# Patient Record
Sex: Female | Born: 1985 | Race: Black or African American | Hispanic: No | Marital: Single | Smoking: Never smoker
Health system: Southern US, Community
[De-identification: ages and names within clinical notes are randomized; demographics above are authoritative.]

---

## 2014-09-23 ENCOUNTER — Encounter (HOSPITAL_COMMUNITY): Payer: Self-pay | Admitting: Emergency Medicine

## 2014-09-23 ENCOUNTER — Emergency Department (HOSPITAL_COMMUNITY)
Admission: EM | Admit: 2014-09-23 | Discharge: 2014-09-23 | Disposition: A | Discharge status: Home / Self Care | Payer: Self-pay | Attending: Emergency Medicine | Admitting: Emergency Medicine

## 2014-09-23 ENCOUNTER — Emergency Department (HOSPITAL_COMMUNITY): Payer: Self-pay

## 2014-09-23 DIAGNOSIS — B9689 Other specified bacterial agents as the cause of diseases classified elsewhere: Secondary | ICD-10-CM

## 2014-09-23 DIAGNOSIS — R102 Pelvic and perineal pain unspecified side: Secondary | ICD-10-CM

## 2014-09-23 DIAGNOSIS — N760 Acute vaginitis: Secondary | ICD-10-CM | POA: Insufficient documentation

## 2014-09-23 DIAGNOSIS — D649 Anemia, unspecified: Secondary | ICD-10-CM | POA: Insufficient documentation

## 2014-09-23 LAB — COMPREHENSIVE METABOLIC PANEL
ALT: 12 U/L — ABNORMAL LOW (ref 14–54)
ANION GAP: 7 (ref 5–15)
AST: 17 U/L (ref 15–41)
Albumin: 3.9 g/dL (ref 3.5–5.0)
Alkaline Phosphatase: 51 U/L (ref 38–126)
BUN: 7 mg/dL (ref 6–20)
CHLORIDE: 106 mmol/L (ref 101–111)
CO2: 25 mmol/L (ref 22–32)
Calcium: 9.1 mg/dL (ref 8.9–10.3)
Creatinine, Ser: 0.57 mg/dL (ref 0.44–1.00)
GFR calc non Af Amer: >60 mL/min (ref >60)
Glucose, Bld: 116 mg/dL — ABNORMAL HIGH (ref 65–99)
Potassium: 4.1 mmol/L (ref 3.5–5.1)
SODIUM: 138 mmol/L (ref 135–145)
Total Bilirubin: 1 mg/dL (ref 0.3–1.2)
Total Protein: 7.4 g/dL (ref 6.5–8.1)

## 2014-09-23 LAB — URINALYSIS, ROUTINE W REFLEX MICROSCOPIC
Bilirubin Urine: NEGATIVE
GLUCOSE, UA: NEGATIVE mg/dL
Hgb urine dipstick: NEGATIVE
Ketones, ur: NEGATIVE mg/dL
LEUKOCYTES UA: NEGATIVE
Nitrite: NEGATIVE
PH: 5.5 (ref 5.0–8.0)
PROTEIN: NEGATIVE mg/dL
Specific Gravity, Urine: 1.015 (ref 1.005–1.030)
Urobilinogen, UA: 0.2 mg/dL (ref 0.0–1.0)

## 2014-09-23 LAB — CBC
HCT: 34.2 % — ABNORMAL LOW (ref 36.0–46.0)
HEMOGLOBIN: 10.8 g/dL — AB (ref 12.0–15.0)
MCH: 22.1 pg — AB (ref 26.0–34.0)
MCHC: 31.6 g/dL (ref 30.0–36.0)
MCV: 70.1 fL — AB (ref 78.0–100.0)
Platelets: 213 10*3/uL (ref 150–400)
RBC: 4.88 MIL/uL (ref 3.87–5.11)
RDW: 14.4 % (ref 11.5–15.5)
WBC: 4.1 10*3/uL (ref 4.0–10.5)

## 2014-09-23 LAB — LIPASE, BLOOD: LIPASE: 32 U/L (ref 22–51)

## 2014-09-23 LAB — WET PREP, GENITAL
TRICH WET PREP: NONE SEEN
YEAST WET PREP: NONE SEEN

## 2014-09-23 MED ORDER — IBUPROFEN 600 MG PO TABS: 600.0000 mg | ORAL_TABLET | Freq: Four times a day (QID) | ORAL | Status: DC | PRN

## 2014-09-23 MED ORDER — IBUPROFEN 200 MG PO TABS: 400.0000 mg | ORAL_TABLET | Freq: Four times a day (QID) | ORAL | Status: DC | PRN

## 2014-09-23 MED ORDER — DEXTROSE 5 % IV SOLN
250.0000 mg | Freq: Once | INTRAVENOUS | Status: AC
  Administered 2014-09-23: 250 mg via INTRAVENOUS
  Filled 2014-09-23 (×2): qty 250

## 2014-09-23 MED ORDER — ONDANSETRON HCL 4 MG/2ML IJ SOLN
4.0000 mg | Freq: Once | INTRAMUSCULAR | Status: AC
  Administered 2014-09-23: 4 mg via INTRAVENOUS
  Filled 2014-09-23: qty 2

## 2014-09-23 MED ORDER — AZITHROMYCIN 250 MG PO TABS
1000.0000 mg | ORAL_TABLET | Freq: Once | ORAL | Status: AC
  Administered 2014-09-23: 1000 mg via ORAL
  Filled 2014-09-23: qty 4

## 2014-09-23 MED ORDER — METRONIDAZOLE 500 MG PO TABS
2000.0000 mg | ORAL_TABLET | Freq: Once | ORAL | Status: AC
  Administered 2014-09-23: 2000 mg via ORAL
  Filled 2014-09-23: qty 4

## 2014-09-23 MED ORDER — MORPHINE SULFATE (PF) 4 MG/ML IV SOLN
4.0000 mg | Freq: Once | INTRAVENOUS | Status: AC
Start: 2014-09-23 — End: 2014-09-23
  Administered 2014-09-23: 4 mg via INTRAVENOUS
  Filled 2014-09-23: qty 1

## 2014-09-23 MED ORDER — SODIUM CHLORIDE 0.9 % IV BOLUS (SEPSIS)
1000.0000 mL | Freq: Once | INTRAVENOUS | Status: AC
  Administered 2014-09-23: 1000 mL via INTRAVENOUS

## 2014-09-23 NOTE — ED Notes (Signed)
Pt alert

## 2014-09-23 NOTE — ED Notes (Signed)
The pt is alert c/o being cold at present .  Warm blanket   hhn completed on arrival nasal 02 at 6 liters

## 2014-09-23 NOTE — ED Notes (Signed)
Patient states abdominal pain with radiation to low back pain x 2 weeks.   Denies nausea, vomiting.   Patient states did have some constipation for approximately 3 days, but has resolved.

## 2014-09-23 NOTE — ED Provider Notes (Signed)
CSN: [id number]     Arrival date & time 09/23/14  1324 History   First MD Initiated Contact with Patient 09/23/14 1055     Chief Complaint  Patient presents with  . Abdominal Pain  . Back Pain     (Consider location/radiation/quality/duration/timing/severity/associated sxs/prior Treatment) HPI   Lauren Speaking- patients friend is here helping to translate.  PCP: No primary care provider on file. Blood pressure 114/79, pulse 84, temperature 97.3 F (36.3 C), temperature source Oral, resp. rate 16, height  (1.626 m), weight 140 lb (63.504 kg), last menstrual period 08/31/2014, SpO2 100 %.  Lauren Hoffman is a 29 y.o.female with no significant PMH presents to the ER with complaints of lower suprapubic/RLQ abdominal pain that wraps around towards her back for the past two weeks that has been worsening. She is also having vaginal itching/burning. She has not had any nausea and vomiting. She did have constipation but it resolved. She also reports subjective fever once a few days ago but it relieved after Advil. She describes her discomfort at 8/10 currently.    The patient denies diaphoresis, headache, weakness (general or focal), confusion, change of vision,  neck pain, dysphagia, aphagia, chest pain, shortness of breath,  back pain, nausea, vomiting, diarrhea, lower extremity swelling, rash.  No past medical history on file. History reviewed. No pertinent past surgical history. No family history on file. Social History  Substance Use Topics  . Smoking status: Never Smoker   . Smokeless tobacco: None  . Alcohol Use: Yes   OB History    No data available     Review of Systems  10 Systems reviewed and are negative for acute change except as noted in the HPI.    Allergies  Review of patient's allergies indicates no known allergies.  Home Medications   Prior to Admission medications   Medication Sig Start Date End Date Taking? Authorizing Provider  ibuprofen  (ADVIL,MOTRIN) 200 MG tablet Take 2 tablets (400 mg total) by mouth every 6 (six) hours as needed for mild pain or moderate pain. 09/23/14   Clara Smolen Neva Seat, PA-C  ibuprofen (ADVIL,MOTRIN) 600 MG tablet Take 1 tablet (600 mg total) by mouth every 6 (six) hours as needed. 09/23/14   Yiannis Tulloch Neva Seat, PA-C   BP 98/56 mmHg  Pulse 80  Temp(Src) 97.3 F (36.3 C) (Oral)  Resp 18  Ht  (1.626 m)  Wt 140 lb (63.504 kg)  BMI 24.02 kg/m2  SpO2 100%  LMP 08/31/2014 Physical Exam  Constitutional: She appears well-developed and well-nourished. No distress.  HENT:  Head: Normocephalic and atraumatic.  Eyes: Pupils are equal, round, and reactive to light.  Neck: Normal range of motion. Neck supple.  Cardiovascular: Normal rate and regular rhythm.   Pulmonary/Chest: Effort normal.  Abdominal: Soft. Bowel sounds are normal. She exhibits no distension. There is tenderness in the suprapubic area. There is no rigidity, no rebound, no guarding and no CVA tenderness.  Genitourinary: Uterus normal. Cervix exhibits no motion tenderness and no discharge. Right adnexum displays no mass and no tenderness. Left adnexum displays tenderness. Left adnexum displays no mass. No tenderness in the vagina. Vaginal discharge (white creamy discharge) found.  Neurological: She is alert.  Skin: Skin is warm and dry.  Nursing note and vitals reviewed.   ED Course  Procedures (including critical care time) Labs Review Labs Reviewed  WET PREP, GENITAL - Abnormal; Notable for the following:    Clue Cells Wet Prep HPF POC MODERATE (*)  WBC, Wet Prep HPF POC MODERATE (*)    All other components within normal limits  COMPREHENSIVE METABOLIC PANEL - Abnormal; Notable for the following:    Glucose, Bld 116 (*)    ALT 12 (*)    All other components within normal limits  CBC - Abnormal; Notable for the following:    Hemoglobin 10.8 (*)    HCT 34.2 (*)    MCV 70.1 (*)    MCH 22.1 (*)    All other components within normal  limits  LIPASE, BLOOD  URINALYSIS, ROUTINE W REFLEX MICROSCOPIC (NOT AT [facility])  GC/CHLAMYDIA PROBE AMP ([place]) NOT AT [facility]    Imaging Review US Transvaginal Non-ob  09/23/2014   CLINICAL DATA:  Right lower quadrant suprapubic pain for 2 weeks. Question torsion.  EXAM: TRANSABDOMINAL AND TRANSVAGINAL ULTRASOUND OF PELVIS  DOPPLER ULTRASOUND OF OVARIES  TECHNIQUE: Both transabdominal and transvaginal ultrasound examinations of the pelvis were performed. Transabdominal technique was performed for global imaging of the pelvis including uterus, ovaries, adnexal regions, and pelvic cul-de-sac.  It was necessary to proceed with endovaginal exam following the transabdominal exam to visualize the uterus, endometrium, ovaries and adnexal regions. Color and duplex Doppler ultrasound was utilized to evaluate blood flow to the ovaries.  COMPARISON:  None.  FINDINGS: Uterus  Measurements: 9.3 x 5.0 x 5.0 cm. No fibroids or other mass visualized.  Endometrium  Thickness: 8 mm.  No focal abnormality visualized.  Right ovary  Measurements: 3.7 x 2.3 x 2.4 cm. Normal appearance/no adnexal mass.  Left ovary  Measurements: 3.8 x 2.7 x 2.5 cm. Normal appearance/no adnexal mass.  Pulsed Doppler evaluation of both ovaries demonstrates normal low-resistance arterial and venous waveforms.  Other findings  No free fluid.  IMPRESSION: Normal exam.   Electronically Signed   By: Leanna Battles M.D.   On: 09/23/2014 15:35   US Pelvis Complete  09/23/2014   CLINICAL DATA:  Right lower quadrant suprapubic pain for 2 weeks. Question torsion.  EXAM: TRANSABDOMINAL AND TRANSVAGINAL ULTRASOUND OF PELVIS  DOPPLER ULTRASOUND OF OVARIES  TECHNIQUE: Both transabdominal and transvaginal ultrasound examinations of the pelvis were performed. Transabdominal technique was performed for global imaging of the pelvis including uterus, ovaries, adnexal regions, and pelvic cul-de-sac.  It was necessary to proceed with endovaginal exam following the  transabdominal exam to visualize the uterus, endometrium, ovaries and adnexal regions. Color and duplex Doppler ultrasound was utilized to evaluate blood flow to the ovaries.  COMPARISON:  None.  FINDINGS: Uterus  Measurements: 9.3 x 5.0 x 5.0 cm. No fibroids or other mass visualized.  Endometrium  Thickness: 8 mm.  No focal abnormality visualized.  Right ovary  Measurements: 3.7 x 2.3 x 2.4 cm. Normal appearance/no adnexal mass.  Left ovary  Measurements: 3.8 x 2.7 x 2.5 cm. Normal appearance/no adnexal mass.  Pulsed Doppler evaluation of both ovaries demonstrates normal low-resistance arterial and venous waveforms.  Other findings  No free fluid.  IMPRESSION: Normal exam.   Electronically Signed   By: Leanna Battles M.D.   On: 09/23/2014 15:35   [place] Art/ven Flow Abd Pelv Doppler  09/23/2014   CLINICAL DATA:  Right lower quadrant suprapubic pain for 2 weeks. Question torsion.  EXAM: TRANSABDOMINAL AND TRANSVAGINAL ULTRASOUND OF PELVIS  DOPPLER ULTRASOUND OF OVARIES  TECHNIQUE: Both transabdominal and transvaginal ultrasound examinations of the pelvis were performed. Transabdominal technique was performed for global imaging of the pelvis including uterus, ovaries, adnexal regions, and pelvic cul-de-sac.  It was necessary to proceed  with endovaginal exam following the transabdominal exam to visualize the uterus, endometrium, ovaries and adnexal regions. Color and duplex Doppler ultrasound was utilized to evaluate blood flow to the ovaries.  COMPARISON:  None.  FINDINGS: Uterus  Measurements: 9.3 x 5.0 x 5.0 cm. No fibroids or other mass visualized.  Endometrium  Thickness: 8 mm.  No focal abnormality visualized.  Right ovary  Measurements: 3.7 x 2.3 x 2.4 cm. Normal appearance/no adnexal mass.  Left ovary  Measurements: 3.8 x 2.7 x 2.5 cm. Normal appearance/no adnexal mass.  Pulsed Doppler evaluation of both ovaries demonstrates normal low-resistance arterial and venous waveforms.  Other findings  No free fluid.   IMPRESSION: Normal exam.   Electronically Signed   By: Leanna Battles M.D.   On: 09/23/2014 15:35   I have personally reviewed and evaluated these images and lab results as part of my medical decision-making.   EKG Interpretation None      MDM   Final diagnoses:  Bacterial vaginosis    Patients labs show that she is positive for moderate clue cells and WBC. She denies sexual activity within the past 6 months. Agrees for treatment with abx in the ED.  Lipase, CMP, CBC and urinalysis show no acute findings. She is mildly anemic. Will order non OB US to further evaluate the RLQ pain and tenderness. R/o torsion/infarct, abscess or PID.  Medications  morphine 4 MG/ML injection 4 mg (4 mg Intravenous Given 09/23/14 1210)  ondansetron (ZOFRAN) injection 4 mg (4 mg Intravenous Given 09/23/14 1210)  sodium chloride 0.9 % bolus 1,000 mL (0 mLs Intravenous Stopped 09/23/14 1315)  cefTRIAXone (ROCEPHIN) 250 mg in dextrose 5 % 50 mL IVPB (0 mg Intravenous Stopped 09/23/14 1421)  azithromycin (ZITHROMAX) tablet 1,000 mg (1,000 mg Oral Given 09/23/14 1257)  metroNIDAZOLE (FLAGYL) tablet 2,000 mg (2,000 mg Oral Given 09/23/14 1257)  ondansetron (ZOFRAN) injection 4 mg (4 mg Intravenous Given 09/23/14 1301)    Normal [place], patient is feeling better. Referral to womens and pt education given. GC are pending.  Medications  morphine 4 MG/ML injection 4 mg (4 mg Intravenous Given 09/23/14 1210)  ondansetron (ZOFRAN) injection 4 mg (4 mg Intravenous Given 09/23/14 1210)  sodium chloride 0.9 % bolus 1,000 mL (0 mLs Intravenous Stopped 09/23/14 1315)  cefTRIAXone (ROCEPHIN) 250 mg in dextrose 5 % 50 mL IVPB (0 mg Intravenous Stopped 09/23/14 1421)  azithromycin (ZITHROMAX) tablet 1,000 mg (1,000 mg Oral Given 09/23/14 1257)  metroNIDAZOLE (FLAGYL) tablet 2,000 mg (2,000 mg Oral Given 09/23/14 1257)  ondansetron (ZOFRAN) injection 4 mg (4 mg Intravenous Given 09/23/14 1301)    28 y.o.Lauren Hoffman's  evaluation in the Emergency Department is complete. It has been determined that no acute conditions requiring further emergency intervention are present at this time. The patient/guardian have been advised of the diagnosis and plan. We have discussed signs and symptoms that warrant return to the ED, such as changes or worsening in symptoms.  Vital signs are stable at discharge. Filed Vitals:   09/23/14 1528  BP: 98/56  Pulse: 80  Temp:   Resp: 18    Patient/guardian has voiced understanding and agreed to follow-up with the PCP or specialist.     Marlon Pel, PA-C 09/23/14 1543  Linwood Dibbles, MD 09/27/14 [phone]

## 2014-09-23 NOTE — Discharge Instructions (Signed)
Bacterial Vaginosis Bacterial vaginosis is a vaginal infection that occurs when the normal balance of bacteria in the vagina is disrupted. It results from an overgrowth of certain bacteria. This is the most common vaginal infection in women of childbearing age. Treatment is important to prevent complications, especially in pregnant women, as it can cause a premature delivery. CAUSES  Bacterial vaginosis is caused by an increase in harmful bacteria that are normally present in smaller amounts in the vagina. Several different kinds of bacteria can cause bacterial vaginosis. However, the reason that the condition develops is not fully understood. RISK FACTORS Certain activities or behaviors can put you at an increased risk of developing bacterial vaginosis, including:  Having a new sex partner or multiple sex partners.  Douching.  Using an intrauterine device (IUD) for contraception. Women do not get bacterial vaginosis from toilet seats, bedding, swimming pools, or contact with objects around them. SIGNS AND SYMPTOMS  Some women with bacterial vaginosis have no signs or symptoms. Common symptoms include:  Grey vaginal discharge.  A fishlike odor with discharge, especially after sexual intercourse.  Itching or burning of the vagina and vulva.  Burning or pain with urination. DIAGNOSIS  Your health care provider will take a medical history and examine the vagina for signs of bacterial vaginosis. A sample of vaginal fluid may be taken. Your health care provider will look at this sample under a microscope to check for bacteria and abnormal cells. A vaginal pH test may also be done.  TREATMENT  Bacterial vaginosis may be treated with antibiotic medicines. These may be given in the form of a pill or a vaginal cream. A second round of antibiotics may be prescribed if the condition comes back after treatment.  HOME CARE INSTRUCTIONS   Only take over-the-counter or prescription medicines as  directed by your health care provider.  If antibiotic medicine was prescribed, take it as directed. Make sure you finish it even if you start to feel better.  Do not have sex until treatment is completed.  Tell all sexual partners that you have a vaginal infection. They should see their health care provider and be treated if they have problems, such as a mild rash or itching.  Practice safe sex by using condoms and only having one sex partner. SEEK MEDICAL CARE IF:   Your symptoms are not improving after 3 days of treatment.  You have increased discharge or pain.  You have a fever. MAKE SURE YOU:   Understand these instructions.  Will watch your condition.  Will get help right away if you are not doing well or get worse. FOR MORE INFORMATION  Centers for Disease Control and Prevention, Division of STD Prevention: www.cdc.gov/std American Sexual Health Association (ASHA): www.ashastd.org  Document Released: 01/23/2005 Document Revised: 11/13/2012 Document Reviewed: 09/04/2012 ExitCare Patient Information 2015 [employer]. This information is not intended to replace advice given to you by your health care provider. Make sure you discuss any questions you have with your health care provider.  

## 2014-09-23 NOTE — Progress Notes (Signed)
Buddy Duty [facility]  & Eligibility Specialist Partnership for [facility] [phone]  Spoke to patient regarding primary care resources and the [facility] orange card. Orange Physicist, medical provided and explained to patient and family at bedside. My contact information also provided for any future questions or concerns. No other [facility]  & Eligibility Specialist needs identified at this time.

## 2014-09-23 NOTE — ED Notes (Addendum)
Patient's primary language is [place].

## 2014-09-24 LAB — GC/CHLAMYDIA PROBE AMP (~~LOC~~) NOT AT ARMC
Chlamydia: NEGATIVE
NEISSERIA GONORRHEA: NEGATIVE

## 2014-12-18 ENCOUNTER — Ambulatory Visit (INDEPENDENT_AMBULATORY_CARE_PROVIDER_SITE_OTHER): Payer: No Typology Code available for payment source | Admitting: Family Medicine

## 2014-12-18 ENCOUNTER — Encounter: Payer: Self-pay | Admitting: Family Medicine

## 2014-12-18 ENCOUNTER — Other Ambulatory Visit (HOSPITAL_COMMUNITY)
Admission: RE | Admit: 2014-12-18 | Discharge: 2014-12-18 | Disposition: A | Discharge status: Home / Self Care | Payer: No Typology Code available for payment source | Source: Ambulatory Visit | Attending: Internal Medicine | Admitting: Internal Medicine

## 2014-12-18 ENCOUNTER — Other Ambulatory Visit: Payer: Self-pay | Admitting: Family Medicine

## 2014-12-18 VITALS — BP 103/66 | HR 80 | Temp 98.7°F | Resp 16 | Ht 66.5 in | Wt 131.0 lb

## 2014-12-18 DIAGNOSIS — L298 Other pruritus: Secondary | ICD-10-CM

## 2014-12-18 DIAGNOSIS — N760 Acute vaginitis: Secondary | ICD-10-CM

## 2014-12-18 DIAGNOSIS — Z0000 Encounter for general adult medical examination without abnormal findings: Secondary | ICD-10-CM

## 2014-12-18 DIAGNOSIS — Z01419 Encounter for gynecological examination (general) (routine) without abnormal findings: Secondary | ICD-10-CM

## 2014-12-18 DIAGNOSIS — B9689 Other specified bacterial agents as the cause of diseases classified elsewhere: Secondary | ICD-10-CM

## 2014-12-18 DIAGNOSIS — R5383 Other fatigue: Secondary | ICD-10-CM

## 2014-12-18 DIAGNOSIS — Z23 Encounter for immunization: Secondary | ICD-10-CM

## 2014-12-18 DIAGNOSIS — Z131 Encounter for screening for diabetes mellitus: Secondary | ICD-10-CM

## 2014-12-18 DIAGNOSIS — N898 Other specified noninflammatory disorders of vagina: Secondary | ICD-10-CM

## 2014-12-18 DIAGNOSIS — A499 Bacterial infection, unspecified: Secondary | ICD-10-CM

## 2014-12-18 DIAGNOSIS — Z202 Contact with and (suspected) exposure to infections with a predominantly sexual mode of transmission: Secondary | ICD-10-CM

## 2014-12-18 DIAGNOSIS — D649 Anemia, unspecified: Secondary | ICD-10-CM

## 2014-12-18 LAB — POCT WET + KOH PREP
Trich by wet prep: ABSENT
YEAST BY KOH: ABSENT
Yeast by wet prep: ABSENT

## 2014-12-18 LAB — CBC WITH DIFFERENTIAL/PLATELET
BASOS ABS: 0 10*3/uL (ref 0.0–0.1)
BASOS PCT: 1 % (ref 0–1)
Eosinophils Absolute: 0 10*3/uL (ref 0.0–0.7)
Eosinophils Relative: 1 % (ref 0–5)
HCT: 35.4 % — ABNORMAL LOW (ref 36.0–46.0)
HEMOGLOBIN: 11.1 g/dL — AB (ref 12.0–15.0)
Lymphocytes Relative: 60 % — ABNORMAL HIGH (ref 12–46)
Lymphs Abs: 2.6 10*3/uL (ref 0.7–4.0)
MCH: 21.9 pg — ABNORMAL LOW (ref 26.0–34.0)
MCHC: 31.4 g/dL (ref 30.0–36.0)
MCV: 70 fL — ABNORMAL LOW (ref 78.0–100.0)
MONOS PCT: 7 % (ref 3–12)
Monocytes Absolute: 0.3 10*3/uL (ref 0.1–1.0)
NEUTROS ABS: 1.4 10*3/uL — AB (ref 1.7–7.7)
NEUTROS PCT: 31 % — AB (ref 43–77)
Platelets: 290 10*3/uL (ref 150–400)
RBC: 5.06 MIL/uL (ref 3.87–5.11)
RDW: 15.5 % (ref 11.5–15.5)
WBC: 4.4 10*3/uL (ref 4.0–10.5)

## 2014-12-18 LAB — COMPLETE METABOLIC PANEL WITH GFR
ALK PHOS: 55 U/L (ref 33–115)
ALT: 9 U/L (ref 6–29)
AST: 14 U/L (ref 10–30)
Albumin: 4.4 g/dL (ref 3.6–5.1)
BILIRUBIN TOTAL: 1.1 mg/dL (ref 0.2–1.2)
BUN: 9 mg/dL (ref 7–25)
CO2: 25 mmol/L (ref 20–31)
Calcium: 9.8 mg/dL (ref 8.6–10.2)
Chloride: 102 mmol/L (ref 98–110)
Creat: 0.59 mg/dL (ref 0.50–1.10)
GFR, Est African American: >89 mL/min (ref >=60)
Glucose, Bld: 91 mg/dL (ref 65–99)
Potassium: 4.1 mmol/L (ref 3.5–5.3)
SODIUM: 137 mmol/L (ref 135–146)
Total Protein: 7.5 g/dL (ref 6.1–8.1)

## 2014-12-18 LAB — TSH: TSH: 1.048 u[IU]/mL (ref 0.350–4.500)

## 2014-12-18 LAB — HEMOGLOBIN A1C
HEMOGLOBIN A1C: 5.8 % — AB (ref <5.7)
MEAN PLASMA GLUCOSE: 120 mg/dL — AB (ref <117)

## 2014-12-18 MED ORDER — METRONIDAZOLE 500 MG PO TABS: 500.0000 mg | ORAL_TABLET | Freq: Two times a day (BID) | ORAL | Status: DC

## 2014-12-18 NOTE — Progress Notes (Signed)
Subjective:    Patient ID: Lauren Hoffman, female    DOB: 06-04-1985, 29 y.o.   MRN: [id number]  HPI Lauren Hoffman, a 29 year old female presents to establish care. Patient is accompanied by a interpreter, she primarily speaks [place]. She immigrated from the [place], 1 year ago. She is currently complaining of vaginal itching and vaginal discharge over the past several weeks. Lauren Hoffman states that she is not currently sexually active. She has not been sexually active for greater than 1 year. She reports white vaginal discharge and some itching. She denies a foul smell. She states that it has been several years since last pap smear. She maintains that menstrual cycles are negative. She reports some fatigue, denies fever, chest pains, muscle aches, urinary problems, nausea, vomiting, or diarrhea.   History reviewed. No pertinent past medical history.  Immunization History  Administered Date(s) Administered  . Influenza,inj,Quad PF,36+ Mos 12/18/2014  No Known Allergies  Social History   Social History  . Marital Status: Single    Spouse Name: N/A  . Number of Children: N/A  . Years of Education: N/A   Occupational History  . Not on file.   Social History Main Topics  . Smoking status: Never Smoker   . Smokeless tobacco: Not on file  . Alcohol Use: Yes     Comment: occ  . Drug Use: No  . Sexual Activity: Not on file   Other Topics Concern  . Not on file   Social History Narrative   Review of Systems  Constitutional: Positive for fatigue. Negative for fever, activity change and appetite change.  HENT: Negative.   Eyes: Negative.  Negative for photophobia and visual disturbance.  Respiratory: Negative.  Negative for cough and shortness of breath.   Cardiovascular: Negative.   Gastrointestinal: Negative.  Negative for nausea and diarrhea.  Endocrine: Negative for polydipsia, polyphagia and polyuria.  Genitourinary: Positive for vaginal discharge (vaginal  itching).  Musculoskeletal: Negative.   Skin: Negative.   Allergic/Immunologic: Negative for immunocompromised state.  Neurological: Negative.   Hematological: Negative.   Psychiatric/Behavioral: Negative.  Negative for suicidal ideas and sleep disturbance.       Objective:   Physical Exam  Constitutional: She is oriented to person, place, and time. She appears well-developed and well-nourished.  HENT:  Head: Normocephalic and atraumatic.  Right Ear: External ear normal.  Left Ear: External ear normal.  Mouth/Throat: Oropharynx is clear and moist.  Eyes: Conjunctivae and EOM are normal. Pupils are equal, round, and reactive to light.  Neck: Normal range of motion. Neck supple.  Cardiovascular: Normal rate, regular rhythm, normal heart sounds and intact distal pulses.   Pulmonary/Chest: Effort normal and breath sounds normal.  Abdominal: Soft. Bowel sounds are normal. There is no tenderness.  S/P c-section scar (hyperpigmented)  Genitourinary: Uterus normal. There is no rash, tenderness or lesion on the right labia. There is lesion on the left labia. There is no rash or tenderness on the left labia. Uterus is not deviated. Cervix exhibits discharge. Cervix exhibits no motion tenderness and no friability. Right adnexum displays no mass. Left adnexum displays no mass. No erythema, tenderness or bleeding in the vagina. Vaginal discharge (white) found.  Musculoskeletal: Normal range of motion.  Neurological: She is alert and oriented to person, place, and time. She has normal reflexes.  Skin: Skin is warm and dry.  Psychiatric: She has a normal mood and affect. Her behavior is normal. Judgment and thought content normal.  BP 103/66 mmHg  Pulse 80  Temp(Src) 98.7 F (37.1 C) (Oral)  Resp 16  Ht 5' 6.5" (1.689 m)  Wt 131 lb (59.421 kg)  BMI 20.83 kg/m2  LMP 12/09/2014 Assessment & Plan:  1. Itching in the vaginal area 3-5 clue cells per high power field, moderate WBCs,  consistent with bacterial vaginosis.  - POCT wet + KOH prep  2. Vaginal discharge Refer to #1 - POCT wet + KOH prep  3. Bacterial vaginitis - metroNIDAZOLE (FLAGYL) 500 MG tablet; Take 1 tablet (500 mg total) by mouth 2 (two) times daily.  Dispense: 14 tablet; Refill: 0  4. Diabetes mellitus screening Patient has a family history of type 2 diabetes. Will screen - Hemoglobin A1c 5. Possible exposure to STD She reports that she is not currently sexually active. She was previously sexually active and is c/o vaginal itching and increased vaginal discharge. Will screen for STDs - HIV antibody (with reflex) - RPR 6. Routine health maintenance Recommend yearly dental visit Recommend a screening eye examination Recommend monthly self-breast exam and a balanced diet  7. Anemia, unspecified anemia type Reviewed previous hemglobin, 9.9, will re-check today.  - CBC with Differential  8. Other fatigue - CBC with Differential - Hemoglobin A1c - TSH - COMPLETE METABOLIC PANEL WITH GFR  9. Encounter for routine gynecological examination - Cytology - PAP [place]  10. Need for prophylactic vaccination and inoculation against influenza - Flu Vaccine QUAD 36+ mos IM (Fluarix & Fluzone Quad PF  The patient was given clear instructions to go to ER or return to medical center if symptoms do not improve, worsen or new problems develop. The patient verbalized understanding. Will notify patient with laboratory results. RTC: 1 year or as needed Massie MaroonHollis,Dalin Caldera M, FNP

## 2014-12-18 NOTE — Patient Instructions (Signed)
Refrain from wearing silk underwear or thong Use hypoallergenic bodywash (Summer's eve or Vagisil)Metronidazole tablets or capsules What is this medicine? METRONIDAZOLE (me troe NI da zole) is an antiinfective. It is used to treat certain kinds of bacterial and protozoal infections. It will not work for colds, flu, or other viral infections. This medicine may be used for other purposes; ask your health care provider or pharmacist if you have questions. What should I tell my health care provider before I take this medicine? They need to know if you have any of these conditions: -anemia or other blood disorders -disease of the nervous system -fungal or yeast infection -if you drink alcohol containing drinks -liver disease -seizures -an unusual or allergic reaction to metronidazole, or other medicines, foods, dyes, or preservatives -pregnant or trying to get pregnant -breast-feeding How should I use this medicine? Take this medicine by mouth with a full glass of water. Follow the directions on the prescription label. Take your medicine at regular intervals. Do not take your medicine more often than directed. Take all of your medicine as directed even if you think you are better. Do not skip doses or stop your medicine early. Talk to your pediatrician regarding the use of this medicine in children. Special care may be needed. Overdosage: If you think you have taken too much of this medicine contact a poison control center or emergency room at once. NOTE: This medicine is only for you. Do not share this medicine with others. What if I miss a dose? If you miss a dose, take it as soon as you can. If it is almost time for your next dose, take only that dose. Do not take double or extra doses. What may interact with this medicine? Do not take this medicine with any of the following medications: -alcohol or any product that contains alcohol -amprenavir oral  solution -cisapride -disulfiram -dofetilide -dronedarone -paclitaxel injection -pimozide -ritonavir oral solution -sertraline oral solution -sulfamethoxazole-trimethoprim injection -thioridazine -ziprasidone This medicine may also interact with the following medications: -birth control pills -cimetidine -lithium -other medicines that prolong the QT interval (cause an abnormal heart rhythm) -phenobarbital -phenytoin -warfarin This list may not describe all possible interactions. Give your health care provider a list of all the medicines, herbs, non-prescription drugs, or dietary supplements you use. Also tell them if you smoke, drink alcohol, or use illegal drugs. Some items may interact with your medicine. What should I watch for while using this medicine? Tell your doctor or health care professional if your symptoms do not improve or if they get worse. You may get drowsy or dizzy. Do not drive, use machinery, or do anything that needs mental alertness until you know how this medicine affects you. Do not stand or sit up quickly, especially if you are an older patient. This reduces the risk of dizzy or fainting spells. Avoid alcoholic drinks while you are taking this medicine and for three days afterward. Alcohol may make you feel dizzy, sick, or flushed. If you are being treated for a sexually transmitted disease, avoid sexual contact until you have finished your treatment. Your sexual partner may also need treatment. What side effects may I notice from receiving this medicine? Side effects that you should report to your doctor or health care professional as soon as possible: -allergic reactions like skin rash or hives, swelling of the face, lips, or tongue -confusion, clumsiness -difficulty speaking -discolored or sore mouth -dizziness -fever, infection -numbness, tingling, pain or weakness in the hands or feet -  trouble passing urine or change in the amount of urine -redness,  blistering, peeling or loosening of the skin, including inside the mouth -seizures -unusually weak or tired -vaginal irritation, dryness, or discharge Side effects that usually do not require medical attention (report to your doctor or health care professional if they continue or are bothersome): -diarrhea -headache -irritability -metallic taste -nausea -stomach pain or cramps -trouble sleeping This list may not describe all possible side effects. Call your doctor for medical advice about side effects. You may report side effects to FDA at 1-800-FDA-1088. Where should I keep my medicine? Keep out of the reach of children. Store at room temperature below 25 degrees C (77 degrees F). Protect from light. Keep container tightly closed. Throw away any unused medicine after the expiration date. NOTE: This sheet is a summary. It may not cover all possible information. If you have questions about this medicine, talk to your doctor, pharmacist, or health care provider.    2016, [employer]. (2012-08-30 14:08:39)

## 2014-12-19 DIAGNOSIS — D649 Anemia, unspecified: Secondary | ICD-10-CM | POA: Insufficient documentation

## 2014-12-19 DIAGNOSIS — N760 Acute vaginitis: Secondary | ICD-10-CM

## 2014-12-19 DIAGNOSIS — B9689 Other specified bacterial agents as the cause of diseases classified elsewhere: Secondary | ICD-10-CM | POA: Insufficient documentation

## 2014-12-19 DIAGNOSIS — Z01419 Encounter for gynecological examination (general) (routine) without abnormal findings: Secondary | ICD-10-CM | POA: Insufficient documentation

## 2014-12-21 LAB — HIV ANTIBODY (ROUTINE TESTING W REFLEX): HIV 1&2 Ab, 4th Generation: NONREACTIVE

## 2014-12-21 LAB — RPR

## 2014-12-22 LAB — CYTOLOGY - PAP

## 2015-01-06 ENCOUNTER — Ambulatory Visit: Payer: No Typology Code available for payment source | Admitting: Family Medicine

## 2015-01-07 ENCOUNTER — Ambulatory Visit: Payer: No Typology Code available for payment source | Admitting: Family Medicine

## 2015-01-14 ENCOUNTER — Encounter: Payer: Self-pay | Admitting: Family Medicine

## 2015-01-14 ENCOUNTER — Ambulatory Visit (INDEPENDENT_AMBULATORY_CARE_PROVIDER_SITE_OTHER): Payer: No Typology Code available for payment source | Admitting: Family Medicine

## 2015-01-14 VITALS — BP 122/77 | HR 74 | Temp 98.0°F | Wt 134.0 lb

## 2015-01-14 DIAGNOSIS — R109 Unspecified abdominal pain: Secondary | ICD-10-CM

## 2015-01-14 DIAGNOSIS — R1031 Right lower quadrant pain: Secondary | ICD-10-CM

## 2015-01-14 DIAGNOSIS — N912 Amenorrhea, unspecified: Secondary | ICD-10-CM

## 2015-01-14 DIAGNOSIS — M546 Pain in thoracic spine: Secondary | ICD-10-CM

## 2015-01-14 DIAGNOSIS — Z23 Encounter for immunization: Secondary | ICD-10-CM

## 2015-01-14 DIAGNOSIS — K219 Gastro-esophageal reflux disease without esophagitis: Secondary | ICD-10-CM

## 2015-01-14 DIAGNOSIS — G8929 Other chronic pain: Secondary | ICD-10-CM

## 2015-01-14 LAB — POCT URINALYSIS DIP (DEVICE)
BILIRUBIN URINE: NEGATIVE
Glucose, UA: NEGATIVE mg/dL
Hgb urine dipstick: NEGATIVE
KETONES UR: NEGATIVE mg/dL
Leukocytes, UA: NEGATIVE
Nitrite: NEGATIVE
PH: 5.5 (ref 5.0–8.0)
Protein, ur: NEGATIVE mg/dL
SPECIFIC GRAVITY, URINE: 1.02 (ref 1.005–1.030)
Urobilinogen, UA: 0.2 mg/dL (ref 0.0–1.0)

## 2015-01-14 LAB — POCT URINALYSIS DIPSTICK
BILIRUBIN UA: NEGATIVE
Blood, UA: NEGATIVE
GLUCOSE UA: NEGATIVE
KETONES UA: NEGATIVE
LEUKOCYTES UA: NEGATIVE
Nitrite, UA: NEGATIVE
PROTEIN UA: NEGATIVE
Spec Grav, UA: 1.02
Urobilinogen, UA: 0.2
pH, UA: 5.5

## 2015-01-14 LAB — POCT URINE PREGNANCY: Preg Test, Ur: NEGATIVE

## 2015-01-14 MED ORDER — ESOMEPRAZOLE MAGNESIUM 40 MG PO CPDR: 40.0000 mg | DELAYED_RELEASE_CAPSULE | Freq: Every day | ORAL | Status: AC

## 2015-01-14 MED ORDER — NAPROXEN 500 MG PO TABS: 500.0000 mg | ORAL_TABLET | Freq: Two times a day (BID) | ORAL | Status: AC

## 2015-01-14 NOTE — Progress Notes (Signed)
Patient ID: Lauren Hoffman, female   DOB: 1985/06/09, 29 y.o.   MRN: [id number]   Lauren Hoffman, is a 29 y.o. female  [id number]  [id number]  DOB - 03-30-1985  CC:  Chief Complaint  Patient presents with  . Amenorrhea       HPI: Lauren Hoffman is a 29 y.o. female here for back and groin pain. The groin has been painful for several weeks, the back for 3 days. Both areas only are painful with movement and not at rest. She denies any known injury. Bending is the most painful movement for the groin component. She reports no alleviating factors. She originally made the appointment for ammenorrhea, but she has just had a period. Before claryfing this we performed a pregnancy was negative, No Known Allergies No past medical history on file. No current outpatient prescriptions on file prior to visit.   No current facility-administered medications on file prior to visit.   Family History  Problem Relation Age of Onset  . Diabetes Father    Social History   Social History  . Marital Status: Single    Spouse Name: N/A  . Number of Children: N/A  . Years of Education: N/A   Occupational History  . Not on file.   Social History Main Topics  . Smoking status: Never Smoker   . Smokeless tobacco: Not on file  . Alcohol Use: Yes     Comment: occ  . Drug Use: No  . Sexual Activity: Not on file   Other Topics Concern  . Not on file   Social History Narrative    Review of Systems: Constitutional: Negative for fever, chills, appetite change, weight loss,  Fatigue. Skin: Negative for rashes or lesions of concern. HENT: Negative for ear pain, ear discharge.nose bleeds Eyes: Negative for pain, discharge, redness, itching and visual disturbance. Neck: Negative for pain, stiffness Respiratory: Negative for cough, shortness of breath,   Cardiovascular: Negative for chest pain, palpitations and leg swelling. Gastrointestinal: Negative for abdominal pain,  nausea, vomiting, diarrhea, constipations Genitourinary: Negative for dysuria, urgency, frequency, hematuria,  Musculoskeletal: Positive back pain and right groin pain.Negative for joint  swelling, and gait problem.Negative for weakness. Neurological: Negative for dizziness, tremors, seizures, syncope,   light-headedness, numbness and headaches.  Hematological: Negative for easy bruising or bleeding Psychiatric/Behavioral: Negative for depression, anxiety, decreased concentration, confusion   Objective:   Filed Vitals:   01/14/15 1311  BP: 122/77  Pulse: 74  Temp: 98 F (36.7 C)    Physical Exam: Constitutional: Patient appears well-developed and well-nourished. No distress. HENT: Normocephalic, atraumatic, External right and left ear normal. Oropharynx is clear and moist.  Eyes: Conjunctivae and EOM are normal. PERRLA, no scleral icterus. Neck: Normal ROM. Neck supple. No lymphadenopathy, No thyromegaly. CVS: RRR, S1/S2 +, no murmurs, no gallops, no rubs Pulmonary: Effort and breath sounds normal, no stridor, rhonchi, wheezes, rales.  Abdominal: Soft. Normoactive BS,, no distension, tenderness, rebound or guarding.  Musculoskeletal: Normal range of motion. No edema. There is spot tenderness in the right groin, near the top of the thigh. Neuro: Alert.Normal muscle tone coordination. Non-focal Skin: Skin is warm and dry. No rash noted. Not diaphoretic. No erythema. No pallor. Psychiatric: Normal mood and affect. Behavior, judgment, thought content normal.  Lab Results  Component Value Date   WBC 4.4 12/18/2014   HGB 11.1* 12/18/2014   HCT 35.4* 12/18/2014   MCV 70.0* 12/18/2014   PLT 290 12/18/2014   Lab Results  Component Value Date  CREATININE 0.59 12/18/2014   BUN 9 12/18/2014   NA 137 12/18/2014   K 4.1 12/18/2014   CL 102 12/18/2014   CO2 25 12/18/2014    Lab Results  Component Value Date   HGBA1C 5.8* 12/18/2014   Lipid Panel  No results found for: CHOL,  TRIG, HDL, CHOLHDL, VLDL, LDLCALC     Assessment and plan:   1. Amenorrhea - POCT urine pregnancy (negative)  2. Right lower quadrant abdominal pain - POCT urinalysis dipstick  3. Need for vaccination  - Tdap vaccine greater than or equal to 7yo IM  4. Right-sided thoracic back pain -naproxen 500 bid, #60, one po bid, with no RF  5. Groin pain, chronic, right  -naproxen 500 bid, #60, one po bid, with no RF  Return if symptoms worsen or fail to improve.  The patient was given clear instructions to go to ER or return to medical center if symptoms don't improve, worsen or new problems develop. The patient verbalized understanding.    Lauren HooverLinda C Bernhardt FNP  01/14/2015, 2:29 PM

## 2015-01-14 NOTE — Patient Instructions (Addendum)
Take naproxen twice a day with food Take Nexium once a day. Follow-up in one month if not better

## 2015-04-05 ENCOUNTER — Other Ambulatory Visit: Payer: Self-pay | Admitting: Family Medicine

## 2015-04-05 DIAGNOSIS — R7611 Nonspecific reaction to tuberculin skin test without active tuberculosis: Secondary | ICD-10-CM

## 2015-04-23 ENCOUNTER — Other Ambulatory Visit: Payer: Self-pay | Admitting: Infectious Disease

## 2015-04-23 ENCOUNTER — Ambulatory Visit
Admission: RE | Admit: 2015-04-23 | Discharge: 2015-04-23 | Disposition: A | Discharge status: Home / Self Care | Payer: No Typology Code available for payment source | Source: Ambulatory Visit | Attending: Infectious Disease | Admitting: Infectious Disease

## 2015-04-23 DIAGNOSIS — R7611 Nonspecific reaction to tuberculin skin test without active tuberculosis: Secondary | ICD-10-CM

## 2016-01-21 ENCOUNTER — Ambulatory Visit: Payer: Self-pay | Attending: Internal Medicine

## 2016-03-16 ENCOUNTER — Encounter (HOSPITAL_COMMUNITY): Payer: Self-pay | Admitting: Emergency Medicine

## 2016-03-16 ENCOUNTER — Ambulatory Visit (HOSPITAL_COMMUNITY)
Admission: EM | Admit: 2016-03-16 | Discharge: 2016-03-16 | Disposition: A | Discharge status: Home / Self Care | Payer: No Typology Code available for payment source | Attending: Emergency Medicine | Admitting: Emergency Medicine

## 2016-03-16 DIAGNOSIS — B373 Candidiasis of vulva and vagina: Secondary | ICD-10-CM | POA: Insufficient documentation

## 2016-03-16 DIAGNOSIS — Z79899 Other long term (current) drug therapy: Secondary | ICD-10-CM | POA: Insufficient documentation

## 2016-03-16 DIAGNOSIS — B3731 Acute candidiasis of vulva and vagina: Secondary | ICD-10-CM

## 2016-03-16 LAB — POCT URINALYSIS DIP (DEVICE)
BILIRUBIN URINE: NEGATIVE
Glucose, UA: NEGATIVE mg/dL
HGB URINE DIPSTICK: NEGATIVE
Ketones, ur: NEGATIVE mg/dL
LEUKOCYTES UA: NEGATIVE
NITRITE: NEGATIVE
Protein, ur: NEGATIVE mg/dL
Specific Gravity, Urine: >=1.03 (ref 1.005–1.030)
UROBILINOGEN UA: 0.2 mg/dL (ref 0.0–1.0)
pH: 6 (ref 5.0–8.0)

## 2016-03-16 LAB — POCT PREGNANCY, URINE: PREG TEST UR: NEGATIVE

## 2016-03-16 MED ORDER — FLUCONAZOLE 200 MG PO TABS: ORAL_TABLET | ORAL | 0 refills | Status: AC

## 2016-03-16 NOTE — ED Triage Notes (Signed)
Pt c/o UTI sx onset: 2 weeks  Sx include: lower abd pain, vag itching, dark yellow color, cloudy urine  Denies: vag d/c, dysuria  A&O x4... NAD

## 2016-03-16 NOTE — Discharge Instructions (Signed)
Your urine does not indicate your have a UTI, the signs and symptoms you have described are consistent with a vaginal yeast infection. We have collected urine to test for yeast and bacteria that could cause your symptoms. Should they be positive, you will be contacted and any additional changes in therapy will be made. Should your signs or symptoms fail to improve or worsen, follow up with your primary care provider or return to clinic.

## 2016-03-16 NOTE — ED Provider Notes (Signed)
CSN: [id number]     Arrival date & time 03/16/16  1136 History   None    Chief Complaint  Patient presents with  . Urinary Tract Infection   (Consider location/radiation/quality/duration/timing/severity/associated sxs/prior Treatment) 31 year old female presents to clinic with chief complaint of dysuria, burning with urination, vaginal itching and yellow, chunky discharge. She denies being sexually active, starting her husband is out of country for many months. She reports she is on her period, denies pregnancy. No fever, nausea, vomiting, or other systemic symptoms   The history is provided by the patient.  Urinary Tract Infection    History reviewed. No pertinent past medical history. Past Surgical History:  Procedure Laterality Date  . CESAREAN SECTION     Family History  Problem Relation Age of Onset  . Diabetes Father    Social History  Substance Use Topics  . Smoking status: Never Smoker  . Smokeless tobacco: Never Used  . Alcohol use Yes     Comment: occ   OB History    No data available     Review of Systems  Reason unable to perform ROS: as covered in HPI.  All other systems reviewed and are negative.   Allergies  Patient has no known allergies.  Home Medications   Prior to Admission medications   Medication Sig Start Date End Date Taking? Authorizing Provider  esomeprazole (NEXIUM) 40 MG capsule Take 1 capsule (40 mg total) by mouth daily. 01/14/15   Henrietta HooverLinda C Bernhardt, NP  fluconazole (DIFLUCAN) 200 MG tablet Take 1 tablet now, then a second tablet in 3 days. 03/16/16   Dorena BodoLawrence Jil Penland, NP  naproxen (NAPROSYN) 500 MG tablet Take 1 tablet (500 mg total) by mouth 2 (two) times daily with a meal. 01/14/15   Henrietta HooverLinda C Bernhardt, NP   Meds Ordered and Administered this Visit  Medications - No data to display  BP 115/70 (BP Location: Right Arm)   Pulse 72   Temp 98.4 F (36.9 C) (Oral)   Resp 18   LMP 02/20/2016   SpO2 100%  No data found.   Physical Exam   Constitutional: She is oriented to person, place, and time. She appears well-developed and well-nourished. No distress.  Cardiovascular: Normal rate and regular rhythm.   Pulmonary/Chest: Effort normal.  Abdominal: Soft. Bowel sounds are normal. She exhibits no distension and no mass. There is no hepatosplenomegaly. There is no tenderness. There is no guarding, no CVA tenderness, no tenderness at McBurney's point and negative Murphy's sign.  Genitourinary:  Genitourinary Comments: Deferred, urine cyto collected   Neurological: She is alert and oriented to person, place, and time.  Skin: Skin is warm and dry. Capillary refill takes less than 2 seconds. She is not diaphoretic.  Psychiatric: She has a normal mood and affect.  Nursing note and vitals reviewed.   Urgent Care Course     Procedures (including critical care time)  Labs Review Labs Reviewed  POCT URINALYSIS DIP (DEVICE)  URINE CYTOLOGY ANCILLARY ONLY    Imaging Review No results found.   Visual Acuity Review  Right Eye Distance:   Left Eye Distance:   Bilateral Distance:    Right Eye Near:   Left Eye Near:    Bilateral Near:         MDM   1. Yeast vaginitis   Your urine does not indicate your have a UTI, the signs and symptoms you have described are consistent with a vaginal yeast infection. We have collected urine  to test for yeast and bacteria that could cause your symptoms. Should they be positive, you will be contacted and any additional changes in therapy will be made. Should your signs or symptoms fail to improve or worsen, follow up with your primary care provider or return to clinic.      Dorena Bodo, NP 03/16/16 1426

## 2016-03-17 LAB — URINE CYTOLOGY ANCILLARY ONLY
CHLAMYDIA, DNA PROBE: NEGATIVE
Neisseria Gonorrhea: NEGATIVE
Trichomonas: NEGATIVE

## 2016-03-20 LAB — URINE CYTOLOGY ANCILLARY ONLY
Bacterial vaginitis: NEGATIVE
Candida vaginitis: NEGATIVE

## 2017-05-18 IMAGING — CR DG CHEST 1V
1 series · 1 of 1 positions shown · non-contrast
Comparison: None.

CLINICAL DATA: Positive PPD.

EXAM:
CHEST 1 VIEW

[w chest pa]
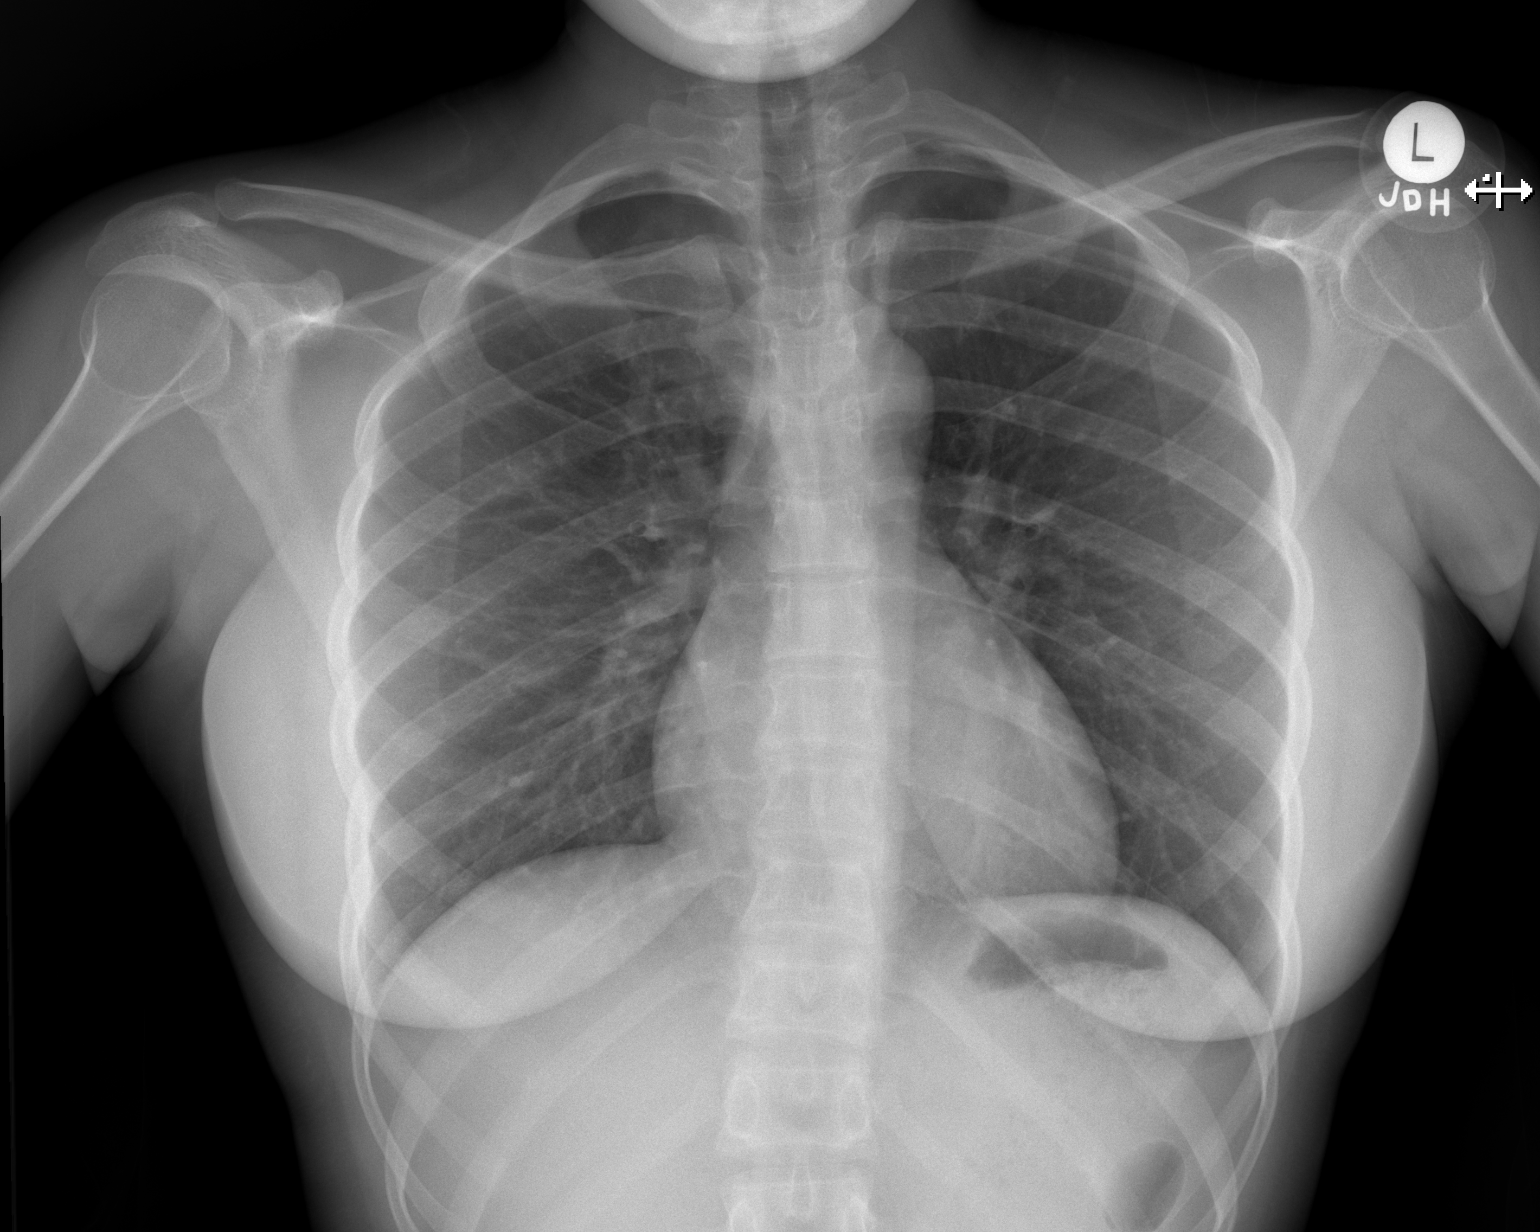

[1 of 1 positions shown; findings below may reference images not displayed]

FINDINGS: Cardiomediastinal silhouette is normal in size and configuration.
Lungs are clear. Lung volumes are normal. No evidence of pneumonia.
No pleural effusion. No pneumothorax.

Osseous and soft tissue structures about the chest are unremarkable.
IMPRESSION: Normal chest x-ray.  No radiographic evidence of tuberculosis.

## 2017-06-20 ENCOUNTER — Other Ambulatory Visit: Payer: Self-pay | Admitting: Family Medicine

## 2017-06-20 ENCOUNTER — Other Ambulatory Visit (HOSPITAL_COMMUNITY)
Admission: RE | Admit: 2017-06-20 | Discharge: 2017-06-20 | Disposition: A | Discharge status: Home / Self Care | Payer: Managed Care, Other (non HMO) | Source: Ambulatory Visit | Attending: Family Medicine | Admitting: Family Medicine

## 2017-06-20 DIAGNOSIS — Z01411 Encounter for gynecological examination (general) (routine) with abnormal findings: Secondary | ICD-10-CM | POA: Insufficient documentation

## 2017-06-22 LAB — CYTOLOGY - PAP: DIAGNOSIS: NEGATIVE
